# Patient Record
Sex: Male | Born: 2000 | Race: White | Hispanic: Yes | Marital: Single | Smoking: Never smoker
Health system: Southern US, Community
[De-identification: ages and names within clinical notes are randomized; demographics above are authoritative.]

---

## 2019-12-31 ENCOUNTER — Emergency Department (HOSPITAL_COMMUNITY): Payer: BLUE CROSS/BLUE SHIELD

## 2019-12-31 ENCOUNTER — Other Ambulatory Visit: Payer: Self-pay

## 2019-12-31 DIAGNOSIS — R079 Chest pain, unspecified: Secondary | ICD-10-CM | POA: Diagnosis not present

## 2019-12-31 DIAGNOSIS — Z5321 Procedure and treatment not carried out due to patient leaving prior to being seen by health care provider: Secondary | ICD-10-CM | POA: Diagnosis not present

## 2019-12-31 LAB — CBC
HCT: 41.9 % (ref 39.0–52.0)
Hemoglobin: 14.3 g/dL (ref 13.0–17.0)
MCH: 28.6 pg (ref 26.0–34.0)
MCHC: 34.1 g/dL (ref 30.0–36.0)
MCV: 83.8 fL (ref 80.0–100.0)
Platelets: 252 10*3/uL (ref 150–400)
RBC: 5 MIL/uL (ref 4.22–5.81)
RDW: 12.2 % (ref 11.5–15.5)
WBC: 8.4 10*3/uL (ref 4.0–10.5)
nRBC: 0 % (ref 0.0–0.2)

## 2019-12-31 LAB — BASIC METABOLIC PANEL
Anion gap: 9 (ref 5–15)
BUN: 13 mg/dL (ref 6–20)
CO2: 27 mmol/L (ref 22–32)
Calcium: 9.3 mg/dL (ref 8.9–10.3)
Chloride: 105 mmol/L (ref 98–111)
Creatinine, Ser: 1 mg/dL (ref 0.61–1.24)
GFR, Estimated: >60 mL/min (ref >60)
Glucose, Bld: 90 mg/dL (ref 70–99)
Potassium: 3.7 mmol/L (ref 3.5–5.1)
Sodium: 141 mmol/L (ref 135–145)

## 2019-12-31 LAB — TROPONIN I (HIGH SENSITIVITY): Troponin I (High Sensitivity): 3 ng/L (ref <18)

## 2019-12-31 NOTE — ED Triage Notes (Signed)
Pt sts left sided chest pain beginning this morning. Took ibuprofen and felt better but pain returned.

## 2020-01-01 ENCOUNTER — Emergency Department (HOSPITAL_COMMUNITY)
Admission: EM | Admit: 2020-01-01 | Discharge: 2020-01-01 | Disposition: A | Discharge status: Home / Self Care | Payer: BLUE CROSS/BLUE SHIELD | Attending: Emergency Medicine | Admitting: Emergency Medicine

## 2020-01-01 NOTE — ED Notes (Signed)
Pt called 3x. Eloped from waiting area.

## 2021-12-31 IMAGING — CR DG CHEST 2V
2 series · 2 of 2 positions shown · non-contrast
Comparison: None.

CLINICAL DATA: Left upper chest pain

EXAM:
CHEST - 2 VIEW

[w chest pa]
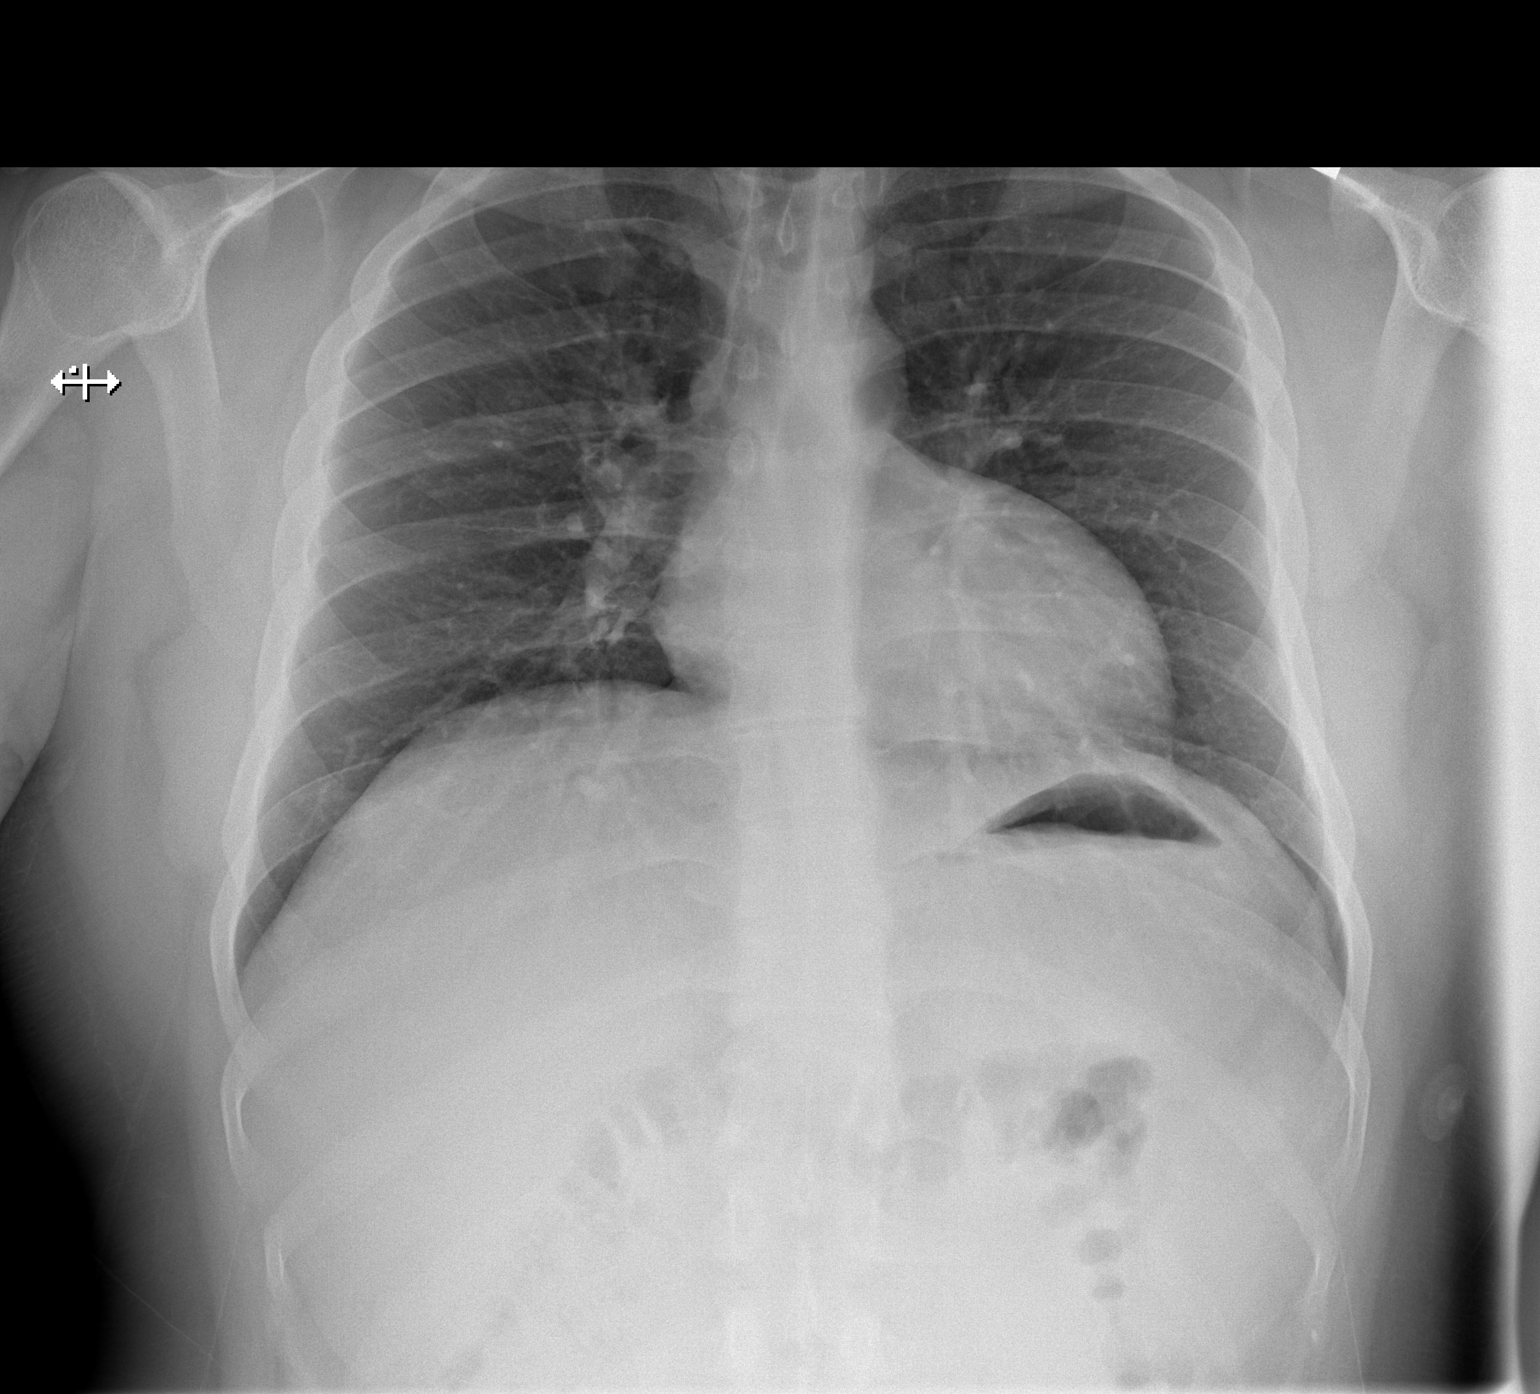

[w chest lat]
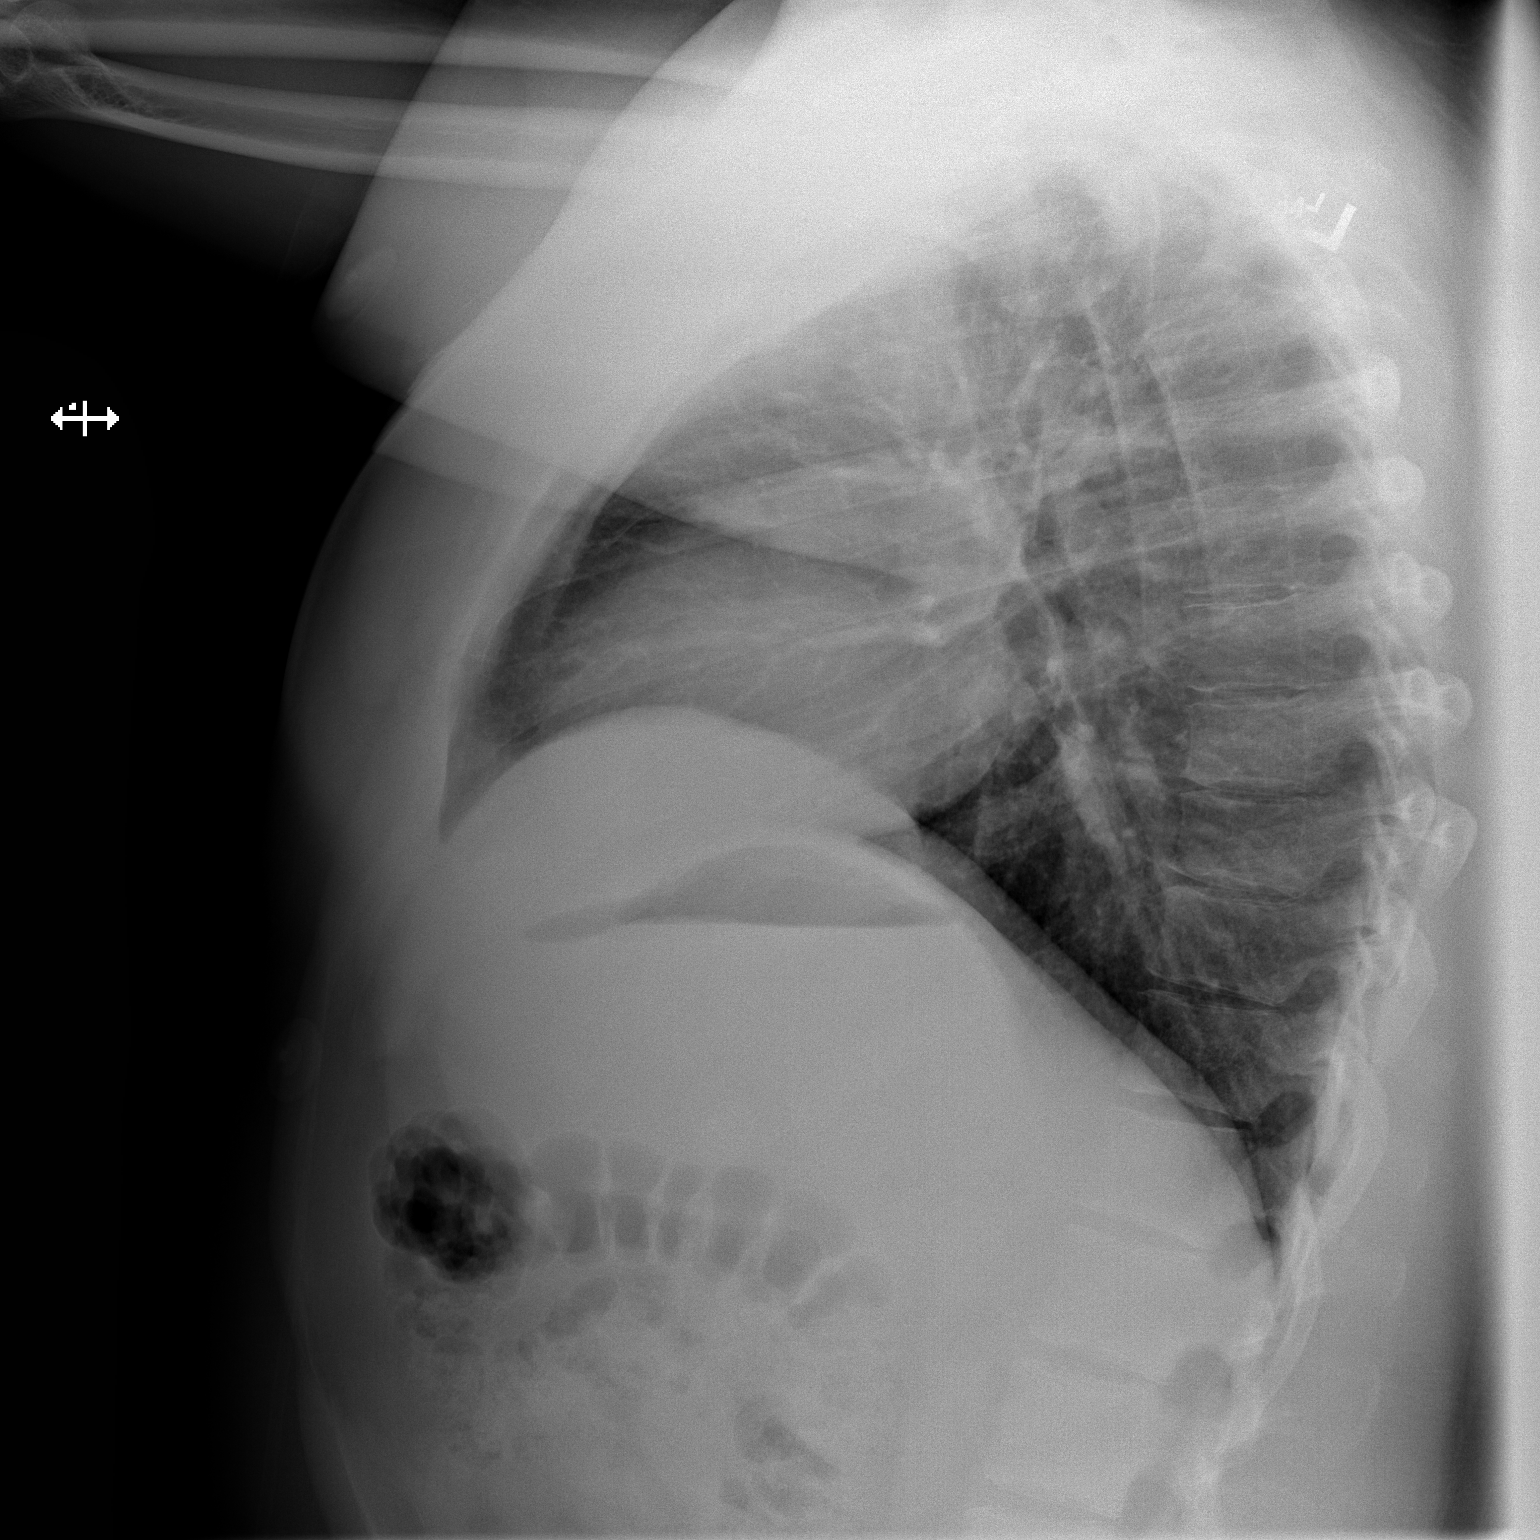

[2 of 2 positions shown; findings below may reference images not displayed]

FINDINGS: The heart size and mediastinal contours are within normal limits.
Both lungs are clear. The visualized skeletal structures are
unremarkable.
IMPRESSION: No active cardiopulmonary disease.
